# Patient Record
Sex: Male | Born: 1985 | Race: White | Hispanic: No | Marital: Married | Smoking: Former smoker
Health system: Southern US, Community
[De-identification: ages and names within clinical notes are randomized; demographics above are authoritative.]

---

## 2015-11-21 ENCOUNTER — Encounter: Payer: Self-pay | Admitting: *Deleted

## 2015-11-21 ENCOUNTER — Ambulatory Visit
Admission: EM | Admit: 2015-11-21 | Discharge: 2015-11-21 | Disposition: A | Discharge status: Home / Self Care | Payer: BLUE CROSS/BLUE SHIELD | Attending: Family Medicine | Admitting: Family Medicine

## 2015-11-21 ENCOUNTER — Ambulatory Visit (INDEPENDENT_AMBULATORY_CARE_PROVIDER_SITE_OTHER): Payer: BLUE CROSS/BLUE SHIELD

## 2015-11-21 DIAGNOSIS — R509 Fever, unspecified: Secondary | ICD-10-CM | POA: Diagnosis not present

## 2015-11-21 DIAGNOSIS — J189 Pneumonia, unspecified organism: Secondary | ICD-10-CM

## 2015-11-21 LAB — RAPID STREP SCREEN (MED CTR MEBANE ONLY): STREPTOCOCCUS, GROUP A SCREEN (DIRECT): NEGATIVE

## 2015-11-21 MED ORDER — LEVOFLOXACIN 500 MG PO TABS: 500.0000 mg | ORAL_TABLET | Freq: Every day | ORAL | Status: AC

## 2015-11-21 MED ORDER — HYDROCOD POLST-CPM POLST ER 10-8 MG/5ML PO SUER: 5.0000 mL | Freq: Two times a day (BID) | ORAL | Status: AC | PRN

## 2015-11-21 NOTE — Discharge Instructions (Signed)
Community-Acquired Pneumonia, Adult Pneumonia is an infection of the lungs. One type of pneumonia can happen while a person is in a hospital. A different type can happen when a person is not in a hospital (community-acquired pneumonia). It is easy for this kind to spread from person to person. It can spread to you if you breathe near an infected person who coughs or sneezes. Some symptoms include:  A dry cough.  A wet (productive) cough.  Fever.  Sweating.  Chest pain. HOME CARE  Take over-the-counter and prescription medicines only as told by your doctor.  Only take cough medicine if you are losing sleep.  If you were prescribed an antibiotic medicine, take it as told by your doctor. Do not stop taking the antibiotic even if you start to feel better.  Sleep with your head and neck raised (elevated). You can do this by putting a few pillows under your head, or you can sleep in a recliner.  Do not use tobacco products. These include cigarettes, chewing tobacco, and e-cigarettes. If you need help quitting, ask your doctor.  Drink enough water to keep your pee (urine) clear or pale yellow. A shot (vaccine) can help prevent pneumonia. Shots are often suggested for:  People older than 31 years of age.  People older than 31 years of age:  Who are having cancer treatment.  Who have long-term (chronic) lung disease.  Who have problems with their body's defense system (immune system). You may also prevent pneumonia if you take these actions:  Get the flu (influenza) shot every year.  Go to the dentist as often as told.  Wash your hands often. If soap and water are not available, use hand sanitizer. GET HELP IF:  You have a fever.  You lose sleep because your cough medicine does not help. GET HELP RIGHT AWAY IF:  You are short of breath and it gets worse.  You have more chest pain.  Your sickness gets worse. This is very serious if:  You are an older adult.  Your  body's defense system is weak.  You cough up blood.   This information is not intended to replace advice given to you by your health care provider. Make sure you discuss any questions you have with your health care provider.   Document Released: 03/04/2008 Document Revised: 06/07/2015 Document Reviewed: 01/11/2015 Elsevier Interactive Patient Education 2016 [employer]  Fever, Adult A fever is an increase in the body's temperature. It is often defined as a temperature of 100 F (38C) or higher. Short mild or moderate fevers often have no long-term effects. They also often do not need treatment. Moderate or high fevers may make you feel uncomfortable. Sometimes, they can also be a sign of a serious illness or disease. The sweating that may happen with repeated fevers or fevers that last a while may also cause you to not have enough fluid in your body (dehydration). You can take your temperature with a thermometer to see if you have a fever. A measured temperature can change with:  Age.  Time of day.  Where the thermometer is placed:  Mouth (oral).  Rectum (rectal).  Ear (tympanic).  Underarm (axillary).  Forehead (temporal). HOME CARE Pay attention to any changes in your symptoms. Take these actions to help with your condition:  Take over-the-counter and prescription medicines only as told by your doctor. Follow the dosing instructions carefully.  If you were prescribed an antibiotic medicine, take it as told by your doctor.  Do not stop taking the antibiotic even if you start to feel better.  Rest as needed.  Drink enough fluid to keep your pee (urine) clear or pale yellow.  Sponge yourself or bathe with room-temperature water as needed. This helps to lower your body temperature . Do not use ice water.  Do not wear too many blankets or heavy clothes. GET HELP IF:  You throw up (vomit).  You cannot eat or drink without throwing up.  You have watery poop  (diarrhea).  It hurts when you pee.  Your symptoms do not get better with treatment.  You have new symptoms.  You feel very weak. GET HELP RIGHT AWAY IF:  You are short of breath or have trouble breathing.  You are dizzy or you pass out (faint).  You feel confused.  You have signs of not having enough fluid in your body, such as:  A dry mouth.  Peeing less.  Looking pale.  You have very bad pain in your belly (abdomen).  You keep throwing up or having water poop.  You have a skin rash.  Your symptoms suddenly get worse.   This information is not intended to replace advice given to you by your health care provider. Make sure you discuss any questions you have with your health care provider.   Document Released: 06/25/2008 Document Revised: 06/07/2015 Document Reviewed: 11/10/2014 Elsevier Interactive Patient Education [employer]

## 2015-11-21 NOTE — ED Notes (Signed)
Sore throat, fever, productive cough- yellow, body aches, and n/v earlier in the week. Onset 1 week ago.

## 2015-11-21 NOTE — ED Provider Notes (Signed)
CSN: [id number]     Arrival date & time 11/21/15  2130 History   First MD Initiated Contact with Patient 11/21/15 1000    Nurses notes were reviewed. Chief Complaint  Patient presents with  . Sore Throat  . Cough  . Fever  . Generalized Body Aches  . Nausea  . Emesis     Patient history is most provided by his wife who is in the room with him. Apparently he's been sick now for about 10 days. His wife thought that he probably have the flu initially laid on the couch because myalgias and aches and pains and fever. The aches and pains continued the fever continued and has had days where he felt better but then comes worse. She states that for the last 48 hours he's actually seems to be regressing. According to her is coughing up greenish material and he has sore throat mostly to most prominent features this going on now. She states it is usually not sick.   No significant pertinent family history there were aware aware of patient's a former smoker.   (Consider location/radiation/quality/duration/timing/severity/associated sxs/prior Treatment) Patient is a 30 y.o. male presenting with pharyngitis, cough, fever, and vomiting. The history is provided by the patient and the spouse.  Sore Throat This is a new problem. The current episode started more than 1 week ago. The problem occurs constantly. The problem has not changed since onset.Associated symptoms include shortness of breath. Pertinent negatives include no chest pain. Nothing aggravates the symptoms. Nothing relieves the symptoms.  Cough Cough characteristics:  Productive Sputum characteristics:  Green Severity:  Moderate Onset quality:  Sudden Duration:  10 days Progression:  Waxing and waning Chronicity:  New Context: upper respiratory infection   Relieved by:  Nothing Worsened by:  Nothing tried Associated symptoms: fever, myalgias, rhinorrhea and shortness of breath   Associated symptoms: no chest pain, no ear pain and no  rash   Fever Associated symptoms: cough, myalgias, rhinorrhea and vomiting   Associated symptoms: no chest pain, no ear pain and no rash   Emesis Associated symptoms: myalgias     History reviewed. No pertinent past medical history. History reviewed. No pertinent past surgical history. History reviewed. No pertinent family history. Social History  Substance Use Topics  . Smoking status: Former Games developer  . Smokeless tobacco: None  . Alcohol Use: Yes    Review of Systems  Constitutional: Positive for fever.  HENT: Positive for rhinorrhea. Negative for ear pain.   Respiratory: Positive for cough and shortness of breath.   Cardiovascular: Negative for chest pain.  Gastrointestinal: Positive for vomiting.  Musculoskeletal: Positive for myalgias.  Skin: Negative for rash.  All other systems reviewed and are negative.   Allergies  Review of patient's allergies indicates no known allergies.  Home Medications   Prior to Admission medications   Medication Sig Start Date End Date Taking? Authorizing Provider  chlorpheniramine-HYDROcodone (TUSSIONEX PENNKINETIC ER) 10-8 MG/5ML SUER Take 5 mLs by mouth every 12 (twelve) hours as needed for cough. 11/21/15   Hassan Rowan, MD  levofloxacin (LEVAQUIN) 500 MG tablet Take 1 tablet (500 mg total) by mouth daily. 11/21/15   Hassan Rowan, MD   Meds Ordered and Administered this Visit  Medications - No data to display  BP 121/80 mmHg  Pulse 86  Temp(Src) 98 F (36.7 C) (Oral)  Ht  (1.778 m)  Wt 230 lb (104.327 kg)  BMI 33.00 kg/m2  SpO2 99% No data found.   Physical Exam  Constitutional: He is oriented to person, place, and time. He appears well-developed and well-nourished.  HENT:  Head: Normocephalic and atraumatic.  Eyes: Conjunctivae are normal. Pupils are equal, round, and reactive to light.  Neck: Normal range of motion. Neck supple. No tracheal deviation present.  Cardiovascular: Normal rate, regular rhythm and normal  heart sounds.   Pulmonary/Chest: Effort normal. No respiratory distress. He has no wheezes. He has rhonchi in the right upper field and the right middle field.  Musculoskeletal: Normal range of motion.  Lymphadenopathy:    He has cervical adenopathy.  Neurological: He is alert and oriented to person, place, and time.  Skin: Skin is warm and dry. No erythema.  Psychiatric: His affect is angry and inappropriate.  Vitals reviewed.   ED Course  Procedures (including critical care time)  Labs Review Labs Reviewed  RAPID STREP SCREEN (NOT AT [facility])  CULTURE, GROUP A STREP [facility])     Imaging Review Dg Chest 2 View  11/21/2015  CLINICAL DATA:  Productive cough, congestion, fever for 1 week EXAM: CHEST  2 VIEW COMPARISON:  None. FINDINGS: Right upper lobe consolidation noted compatible with pneumonia. Left lung is clear. Heart is normal size. No effusions. No acute bony abnormality. IMPRESSION: Right upper lobe pneumonia. Electronically Signed   By: Charlett Nose M.D.   On: 11/21/2015 11:32     Visual Acuity Review  Right Eye Distance:   Left Eye Distance:   Bilateral Distance:    Right Eye Near:   Left Eye Near:    Bilateral Near:     Results for orders placed or performed during the hospital encounter of 11/21/15  Rapid strep screen  Result Value Ref Range   Streptococcus, Group A Screen (Direct) NEGATIVE NEGATIVE      MDM   1. Community acquired pneumonia   2. Other specified fever      Patient shows some passive aggressive tendencies when asked to unbutton his shirt to listen to his lungs and as well for strep test. As well as only single answers such as why you're here and sick with to control I don't feel well  Regardless of the rhonchi is heard on the right lobe which was confirmed pneumonia on the chest x-ray.  We'll place patient on Levaquin 500 mg 1 tablet day, Tussionex 1 teaspoon twice a day for cough and please follow-up PCP of choice in 2-3 weeks for  procedure   Hassan Rowan, MD 11/21/15 1205

## 2015-11-23 LAB — CULTURE, GROUP A STREP (THRC)

## 2017-05-31 IMAGING — CR DG CHEST 2V
2 series · 2 of 2 positions shown · non-contrast
Comparison: None.

CLINICAL DATA: Productive cough, congestion, fever for 1 week

EXAM:
CHEST  2 VIEW

[chest pa]
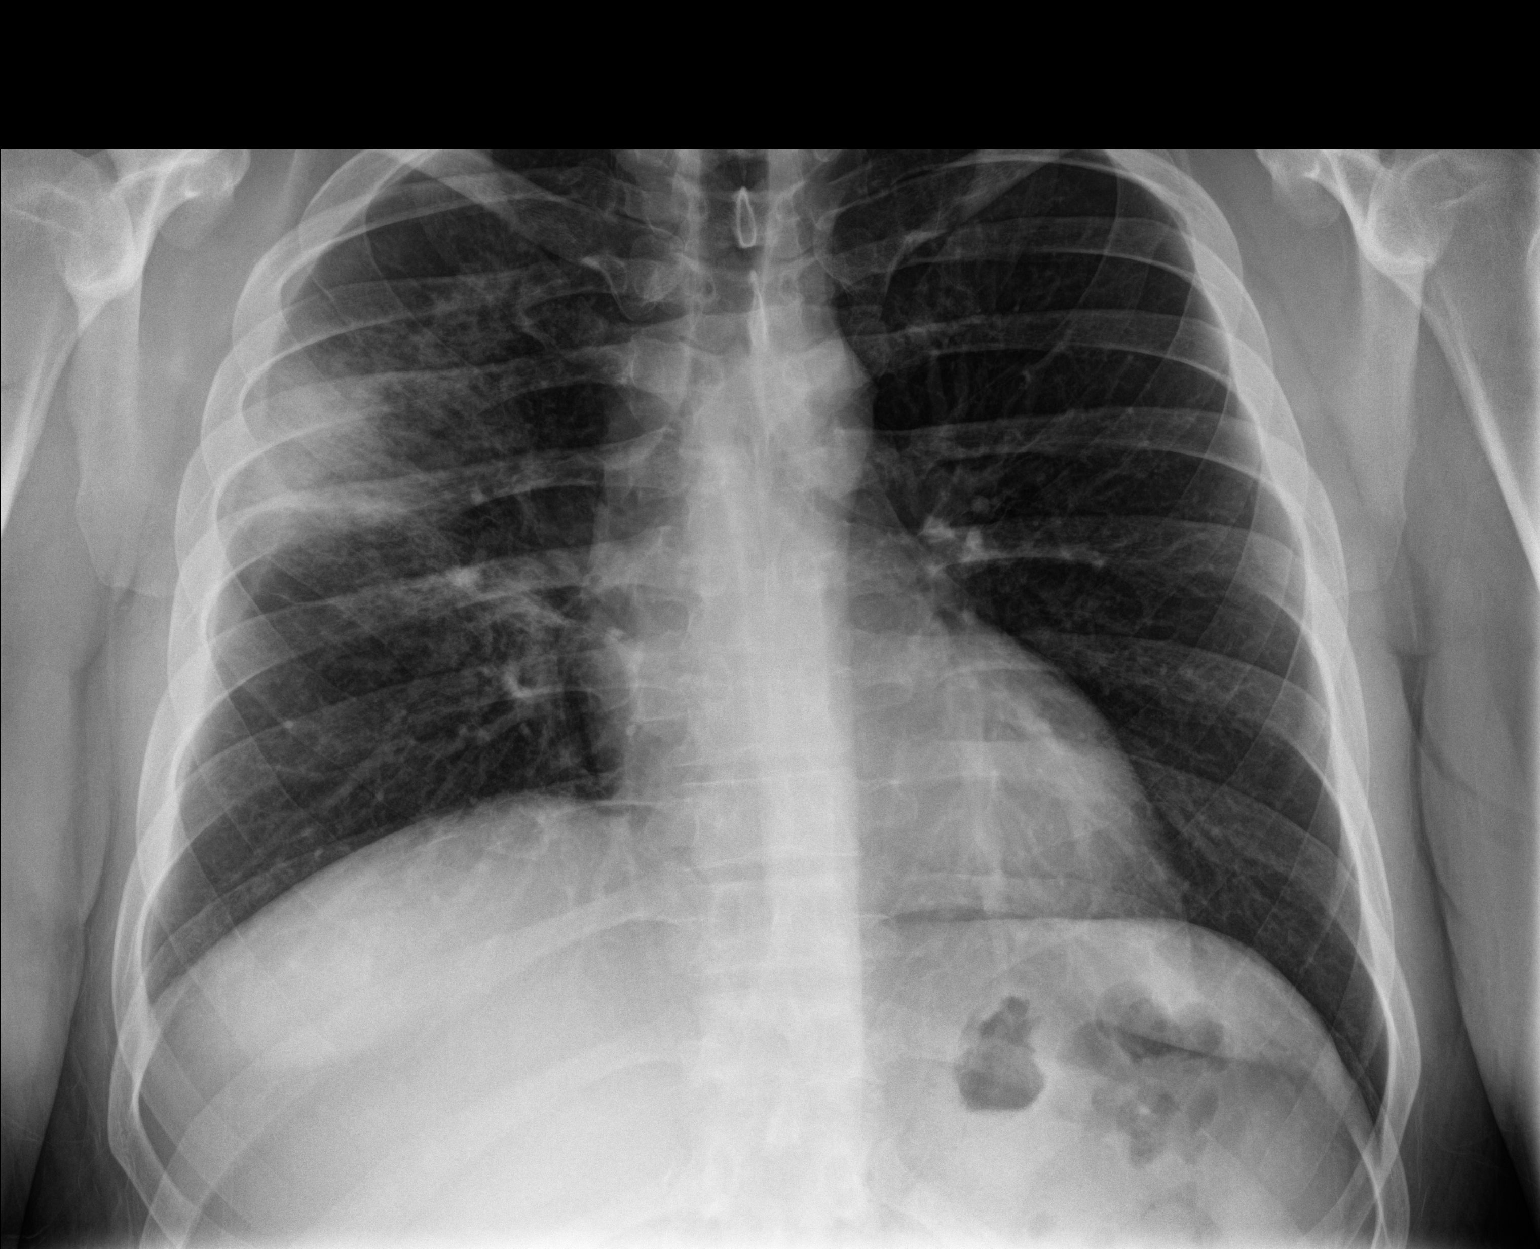

[chest lat]
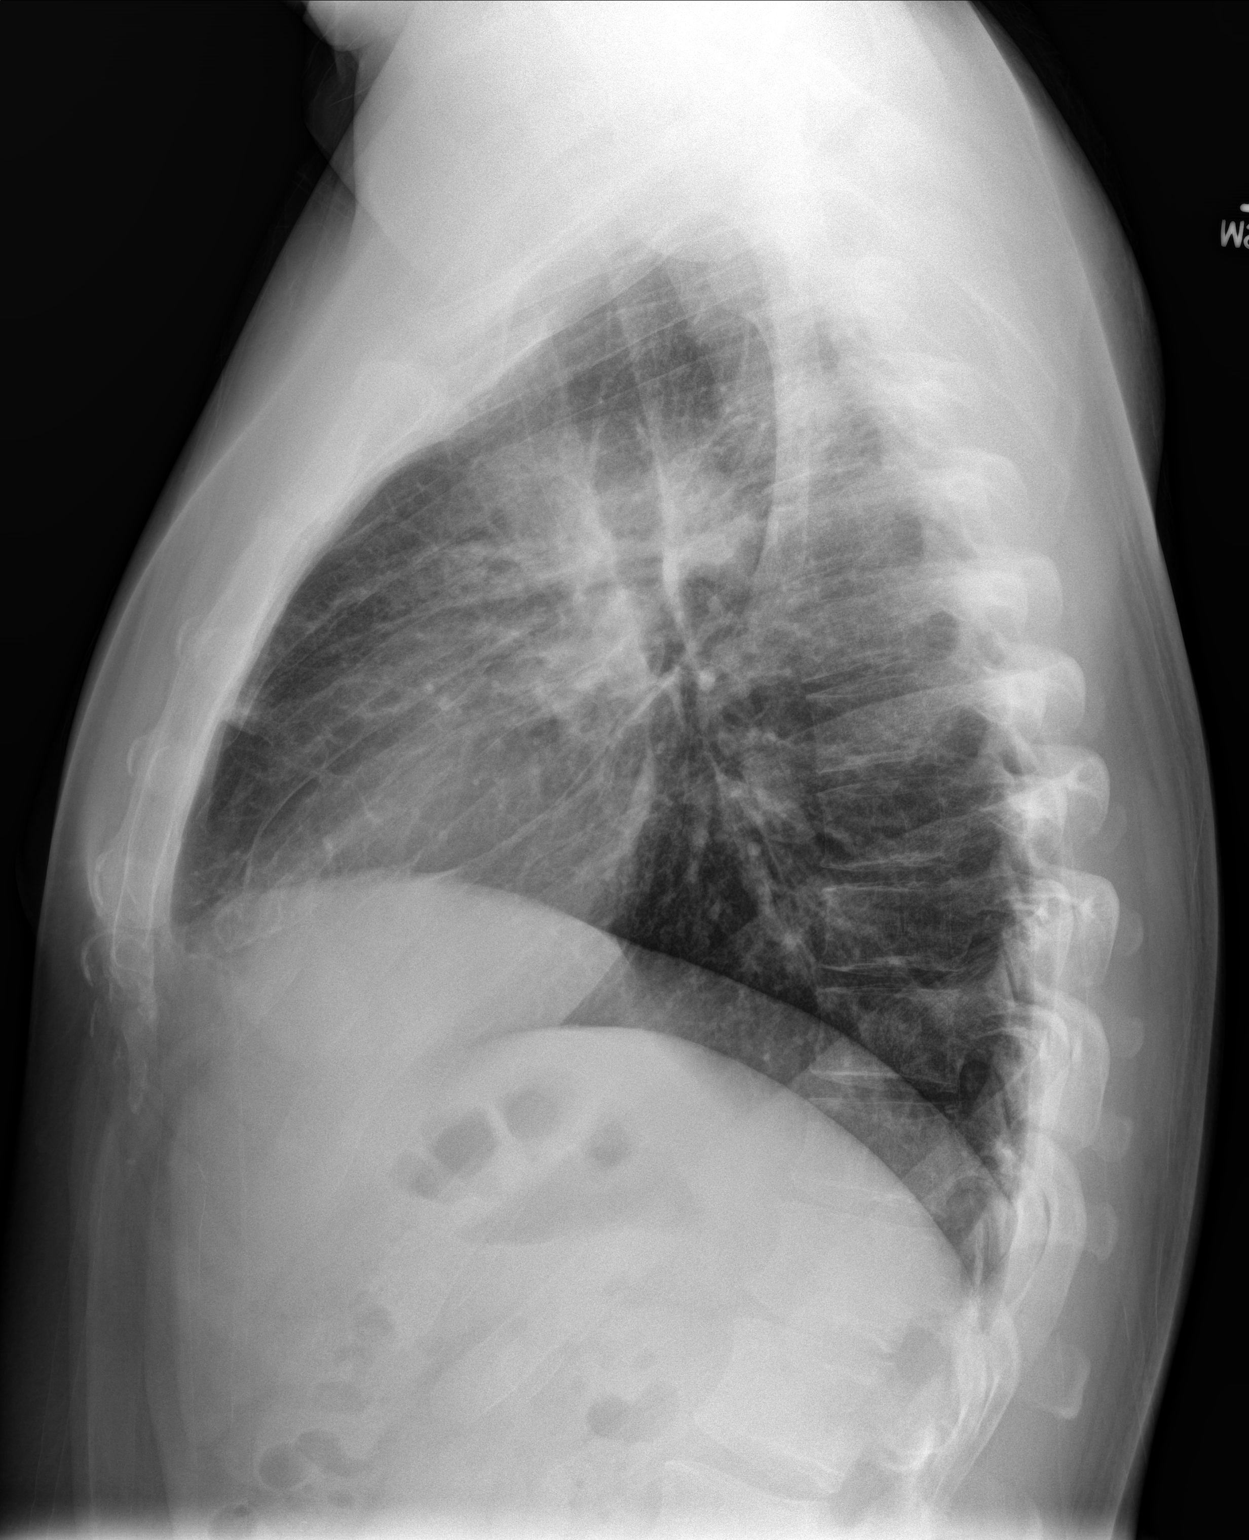

[2 of 2 positions shown; findings below may reference images not displayed]

FINDINGS: Right upper lobe consolidation noted compatible with pneumonia. Left
lung is clear. Heart is normal size. No effusions. No acute bony
abnormality.
IMPRESSION: Right upper lobe pneumonia.
